# Patient Record
Sex: Female | Born: 1955 | Race: White | Hispanic: No | Marital: Married
Health system: Southern US, Community
[De-identification: ages and names within clinical notes are randomized; demographics above are authoritative.]

---

## 2001-10-08 ENCOUNTER — Other Ambulatory Visit: Admission: RE | Admit: 2001-10-08 | Discharge: 2001-10-08 | Payer: Self-pay | Admitting: Gynecology

## 2003-03-23 ENCOUNTER — Other Ambulatory Visit: Admission: RE | Admit: 2003-03-23 | Discharge: 2003-03-23 | Payer: Self-pay | Admitting: Gynecology

## 2004-05-23 ENCOUNTER — Other Ambulatory Visit: Admission: RE | Admit: 2004-05-23 | Discharge: 2004-05-23 | Payer: Self-pay | Admitting: Gynecology

## 2014-01-28 ENCOUNTER — Other Ambulatory Visit: Payer: Self-pay | Admitting: Physician Assistant

## 2014-01-28 DIAGNOSIS — N6489 Other specified disorders of breast: Secondary | ICD-10-CM

## 2014-02-04 ENCOUNTER — Encounter (INDEPENDENT_AMBULATORY_CARE_PROVIDER_SITE_OTHER): Payer: Self-pay

## 2014-02-04 ENCOUNTER — Ambulatory Visit
Admission: RE | Admit: 2014-02-04 | Discharge: 2014-02-04 | Disposition: A | Discharge status: Home / Self Care | Payer: 59 | Source: Ambulatory Visit | Attending: Physician Assistant | Admitting: Physician Assistant

## 2014-02-04 DIAGNOSIS — N6489 Other specified disorders of breast: Secondary | ICD-10-CM

## 2014-10-12 ENCOUNTER — Other Ambulatory Visit: Payer: Self-pay

## 2014-10-12 DIAGNOSIS — Z1231 Encounter for screening mammogram for malignant neoplasm of breast: Secondary | ICD-10-CM

## 2014-11-13 ENCOUNTER — Ambulatory Visit
Admission: RE | Admit: 2014-11-13 | Discharge: 2014-11-13 | Disposition: A | Discharge status: Home / Self Care | Payer: 59 | Source: Ambulatory Visit

## 2014-11-13 DIAGNOSIS — Z1231 Encounter for screening mammogram for malignant neoplasm of breast: Secondary | ICD-10-CM

## 2015-07-06 ENCOUNTER — Other Ambulatory Visit: Payer: Self-pay | Admitting: Obstetrics and Gynecology

## 2015-07-06 DIAGNOSIS — M810 Age-related osteoporosis without current pathological fracture: Secondary | ICD-10-CM

## 2015-07-27 ENCOUNTER — Other Ambulatory Visit: Payer: Self-pay | Admitting: Obstetrics and Gynecology

## 2015-07-27 DIAGNOSIS — E2839 Other primary ovarian failure: Secondary | ICD-10-CM

## 2015-07-29 ENCOUNTER — Ambulatory Visit
Admission: RE | Admit: 2015-07-29 | Discharge: 2015-07-29 | Disposition: A | Discharge status: Home / Self Care | Payer: 59 | Source: Ambulatory Visit | Attending: Obstetrics and Gynecology | Admitting: Obstetrics and Gynecology

## 2015-07-29 DIAGNOSIS — E2839 Other primary ovarian failure: Secondary | ICD-10-CM

## 2015-12-30 ENCOUNTER — Other Ambulatory Visit: Payer: Self-pay | Admitting: Obstetrics and Gynecology

## 2015-12-30 DIAGNOSIS — Z1231 Encounter for screening mammogram for malignant neoplasm of breast: Secondary | ICD-10-CM

## 2016-01-26 ENCOUNTER — Ambulatory Visit
Admission: RE | Admit: 2016-01-26 | Discharge: 2016-01-26 | Disposition: A | Discharge status: Home / Self Care | Payer: 59 | Source: Ambulatory Visit | Attending: Obstetrics and Gynecology | Admitting: Obstetrics and Gynecology

## 2016-01-26 DIAGNOSIS — Z1231 Encounter for screening mammogram for malignant neoplasm of breast: Secondary | ICD-10-CM

## 2016-09-20 DIAGNOSIS — Z0100 Encounter for examination of eyes and vision without abnormal findings: Secondary | ICD-10-CM | POA: Diagnosis not present

## 2016-10-10 DIAGNOSIS — D235 Other benign neoplasm of skin of trunk: Secondary | ICD-10-CM | POA: Diagnosis not present

## 2016-10-10 DIAGNOSIS — D2272 Melanocytic nevi of left lower limb, including hip: Secondary | ICD-10-CM | POA: Diagnosis not present

## 2016-10-10 DIAGNOSIS — L570 Actinic keratosis: Secondary | ICD-10-CM | POA: Diagnosis not present

## 2016-10-10 DIAGNOSIS — L821 Other seborrheic keratosis: Secondary | ICD-10-CM | POA: Diagnosis not present

## 2016-12-18 ENCOUNTER — Other Ambulatory Visit: Payer: Self-pay | Admitting: Obstetrics and Gynecology

## 2016-12-18 DIAGNOSIS — Z1231 Encounter for screening mammogram for malignant neoplasm of breast: Secondary | ICD-10-CM

## 2016-12-28 DIAGNOSIS — Z01419 Encounter for gynecological examination (general) (routine) without abnormal findings: Secondary | ICD-10-CM | POA: Diagnosis not present

## 2016-12-28 DIAGNOSIS — Z124 Encounter for screening for malignant neoplasm of cervix: Secondary | ICD-10-CM | POA: Diagnosis not present

## 2017-01-29 ENCOUNTER — Encounter: Payer: Self-pay | Admitting: Radiology

## 2017-01-29 ENCOUNTER — Ambulatory Visit
Admission: RE | Admit: 2017-01-29 | Discharge: 2017-01-29 | Disposition: A | Discharge status: Home / Self Care | Payer: 59 | Source: Ambulatory Visit | Attending: Obstetrics and Gynecology | Admitting: Obstetrics and Gynecology

## 2017-01-29 DIAGNOSIS — Z1231 Encounter for screening mammogram for malignant neoplasm of breast: Secondary | ICD-10-CM

## 2017-02-21 DIAGNOSIS — Z23 Encounter for immunization: Secondary | ICD-10-CM | POA: Diagnosis not present

## 2017-02-21 DIAGNOSIS — R21 Rash and other nonspecific skin eruption: Secondary | ICD-10-CM | POA: Diagnosis not present

## 2017-03-05 DIAGNOSIS — Z1211 Encounter for screening for malignant neoplasm of colon: Secondary | ICD-10-CM | POA: Diagnosis not present

## 2017-06-22 ENCOUNTER — Other Ambulatory Visit: Payer: Self-pay | Admitting: Physician Assistant

## 2017-06-22 ENCOUNTER — Ambulatory Visit
Admission: RE | Admit: 2017-06-22 | Discharge: 2017-06-22 | Disposition: A | Discharge status: Home / Self Care | Payer: 59 | Source: Ambulatory Visit | Attending: Physician Assistant | Admitting: Physician Assistant

## 2017-06-22 DIAGNOSIS — S6990XA Unspecified injury of unspecified wrist, hand and finger(s), initial encounter: Secondary | ICD-10-CM | POA: Diagnosis not present

## 2017-06-22 DIAGNOSIS — M79644 Pain in right finger(s): Secondary | ICD-10-CM | POA: Diagnosis not present

## 2017-06-22 DIAGNOSIS — T1490XA Injury, unspecified, initial encounter: Secondary | ICD-10-CM

## 2017-06-22 DIAGNOSIS — S6991XA Unspecified injury of right wrist, hand and finger(s), initial encounter: Secondary | ICD-10-CM | POA: Diagnosis not present

## 2017-10-17 DIAGNOSIS — L821 Other seborrheic keratosis: Secondary | ICD-10-CM | POA: Diagnosis not present

## 2017-10-17 DIAGNOSIS — D239 Other benign neoplasm of skin, unspecified: Secondary | ICD-10-CM | POA: Diagnosis not present

## 2017-10-17 DIAGNOSIS — D2272 Melanocytic nevi of left lower limb, including hip: Secondary | ICD-10-CM | POA: Diagnosis not present

## 2017-10-17 DIAGNOSIS — D485 Neoplasm of uncertain behavior of skin: Secondary | ICD-10-CM | POA: Diagnosis not present

## 2017-10-17 DIAGNOSIS — L570 Actinic keratosis: Secondary | ICD-10-CM | POA: Diagnosis not present

## 2017-10-17 DIAGNOSIS — Z85828 Personal history of other malignant neoplasm of skin: Secondary | ICD-10-CM | POA: Diagnosis not present

## 2017-11-21 DIAGNOSIS — Z0100 Encounter for examination of eyes and vision without abnormal findings: Secondary | ICD-10-CM | POA: Diagnosis not present

## 2018-01-16 DIAGNOSIS — Z124 Encounter for screening for malignant neoplasm of cervix: Secondary | ICD-10-CM | POA: Diagnosis not present

## 2018-01-16 DIAGNOSIS — Z01419 Encounter for gynecological examination (general) (routine) without abnormal findings: Secondary | ICD-10-CM | POA: Diagnosis not present

## 2018-02-07 ENCOUNTER — Other Ambulatory Visit: Payer: Self-pay | Admitting: Obstetrics and Gynecology

## 2018-02-07 DIAGNOSIS — Z1231 Encounter for screening mammogram for malignant neoplasm of breast: Secondary | ICD-10-CM

## 2018-03-08 ENCOUNTER — Ambulatory Visit: Payer: 59

## 2018-03-13 DIAGNOSIS — Z1211 Encounter for screening for malignant neoplasm of colon: Secondary | ICD-10-CM | POA: Diagnosis not present

## 2018-04-10 ENCOUNTER — Ambulatory Visit
Admission: RE | Admit: 2018-04-10 | Discharge: 2018-04-10 | Disposition: A | Discharge status: Home / Self Care | Payer: 59 | Source: Ambulatory Visit | Attending: Obstetrics and Gynecology | Admitting: Obstetrics and Gynecology

## 2018-04-10 DIAGNOSIS — Z1231 Encounter for screening mammogram for malignant neoplasm of breast: Secondary | ICD-10-CM

## 2018-08-16 DIAGNOSIS — Z85828 Personal history of other malignant neoplasm of skin: Secondary | ICD-10-CM | POA: Diagnosis not present

## 2018-08-16 DIAGNOSIS — L570 Actinic keratosis: Secondary | ICD-10-CM | POA: Diagnosis not present

## 2018-08-16 DIAGNOSIS — D2272 Melanocytic nevi of left lower limb, including hip: Secondary | ICD-10-CM | POA: Diagnosis not present

## 2019-02-19 IMAGING — CR DG FINGER LITTLE 2+V*R*
3 series · 3 of 3 positions shown · non-contrast
Comparison: None.

CLINICAL DATA: Little finger pain since an injury 1 month ago.

EXAM:
RIGHT LITTLE FINGER 2+V

[x finger pa right]
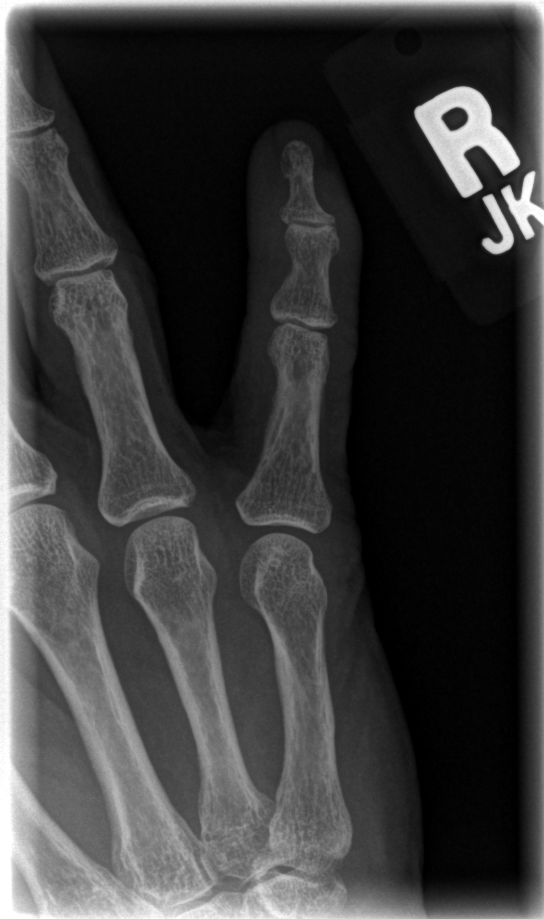

[x finger obl. right]
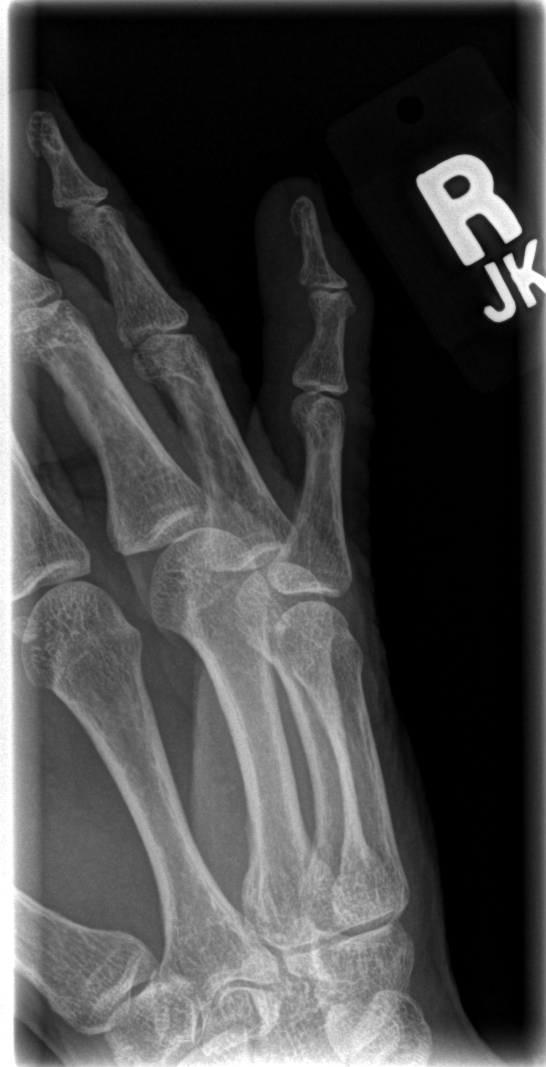

[x finger lateral right]
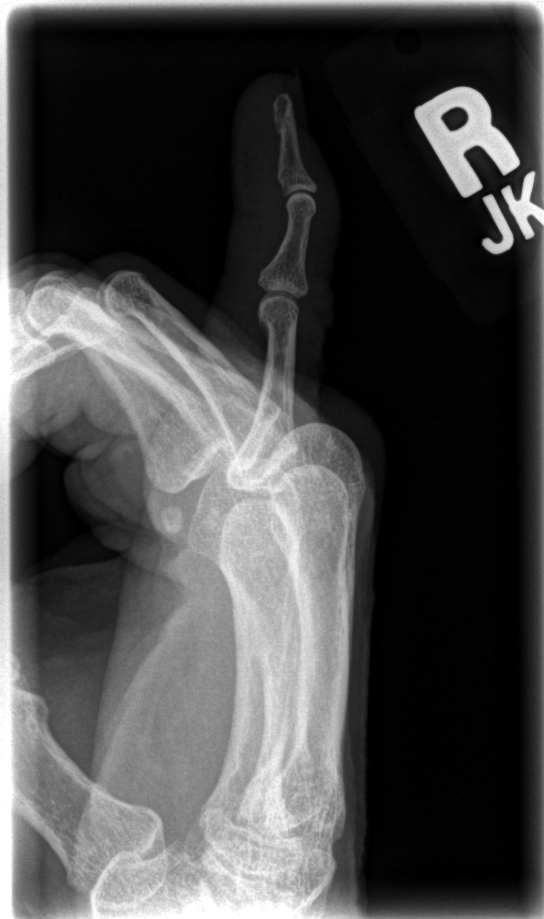

[3 of 3 positions shown; findings below may reference images not displayed]

FINDINGS: There is no fracture or dislocation. There is minimal arthritis of
the DIP joint.
IMPRESSION: Minimal arthritis of the DIP joint.

## 2019-05-22 ENCOUNTER — Other Ambulatory Visit: Payer: Self-pay | Admitting: Obstetrics and Gynecology

## 2019-05-22 DIAGNOSIS — Z1231 Encounter for screening mammogram for malignant neoplasm of breast: Secondary | ICD-10-CM

## 2019-05-26 ENCOUNTER — Ambulatory Visit
Admission: RE | Admit: 2019-05-26 | Discharge: 2019-05-26 | Disposition: A | Discharge status: Home / Self Care | Payer: 59 | Source: Ambulatory Visit | Attending: Obstetrics and Gynecology | Admitting: Obstetrics and Gynecology

## 2019-05-26 ENCOUNTER — Other Ambulatory Visit: Payer: Self-pay

## 2019-05-26 DIAGNOSIS — Z1231 Encounter for screening mammogram for malignant neoplasm of breast: Secondary | ICD-10-CM

## 2020-04-20 ENCOUNTER — Other Ambulatory Visit: Payer: Self-pay | Admitting: Obstetrics and Gynecology

## 2020-04-20 DIAGNOSIS — Z1231 Encounter for screening mammogram for malignant neoplasm of breast: Secondary | ICD-10-CM

## 2020-06-01 ENCOUNTER — Ambulatory Visit
Admission: RE | Admit: 2020-06-01 | Discharge: 2020-06-01 | Disposition: A | Discharge status: Home / Self Care | Payer: 59 | Source: Ambulatory Visit | Attending: Obstetrics and Gynecology | Admitting: Obstetrics and Gynecology

## 2020-06-01 ENCOUNTER — Other Ambulatory Visit: Payer: Self-pay

## 2020-06-01 DIAGNOSIS — Z1231 Encounter for screening mammogram for malignant neoplasm of breast: Secondary | ICD-10-CM

## 2020-08-26 ENCOUNTER — Ambulatory Visit: Payer: 59 | Admitting: Podiatry

## 2020-08-26 ENCOUNTER — Ambulatory Visit (INDEPENDENT_AMBULATORY_CARE_PROVIDER_SITE_OTHER): Payer: 59

## 2020-08-26 ENCOUNTER — Encounter: Payer: Self-pay | Admitting: Podiatry

## 2020-08-26 ENCOUNTER — Other Ambulatory Visit: Payer: Self-pay

## 2020-08-26 DIAGNOSIS — M79671 Pain in right foot: Secondary | ICD-10-CM

## 2020-08-26 DIAGNOSIS — M2021 Hallux rigidus, right foot: Secondary | ICD-10-CM | POA: Diagnosis not present

## 2020-08-26 NOTE — Patient Instructions (Signed)
Hallux Rigidus  Hallux rigidus is a type of joint pain or joint disease (arthritis) that affects your big toe (hallux). This condition involves the joint that connects the base of your big toe to the main part of your foot (metatarsophalangeal joint or MTP joint). This condition can cause your big toe to become stiff, painful, and difficult to move. Symptoms may get worse with movement or in cold or damp weather. The condition gets worse over time. What are the causes? This condition may be caused by having a foot that does not function the way that it should or that has an abnormal shape (structural deformity). These foot problems can run in families and may be passed down from parents to children (are hereditary). This condition can also be caused by:  Injury.  Overuse.  Certain inflammatory diseases, including gout and rheumatoid arthritis. What increases the risk? You are more likely to develop this condition if you have:  A foot bone (metatarsal) that is longer or higher than normal.  A family history of hallux rigidus.  Previously injured your big toe.  Feet that do not have a curve (arch) on the inner side of the foot. This may be called flat feet or fallen arches.  Ankles that turn in when you walk (pronation).  Rheumatoid arthritis or gout.  A job that requires you to stoop down often at work. What are the signs or symptoms? Symptoms of this condition include:  Big toe pain.  Stiffness and difficulty moving the big toe.  Swelling of the toe and surrounding area.  Bone spurs. These are bony growths that can form on the joint of the big toe.  A limp. How is this diagnosed? This condition is diagnosed based on your medical history and a physical exam. You may also have X-rays. How is this treated? This condition is treated by:  Wearing roomy, comfortable shoes that have a large toe box.  Putting orthotic devices in your shoes.  Taking pain medicines.  Having  physical therapy.  Icing the injured area.  Alternating between putting your foot in cold water and then in warm water. If your condition is severe, treatment may include:  Corticosteroid injections to relieve pain.  Surgery to remove bone spurs, fuse damaged bones together, or replace the entire joint. Follow these instructions at home: Managing pain, stiffness, and swelling  Put your feet in cold water for 30 seconds, and then in warm water for 30 seconds. Alternate between the cold and warm water for 5 minutes. Do this several times a day or as told by your health care provider.  If directed, put ice on the injured area. ? Put ice in a plastic bag. ? Place a towel between your skin and the bag. ? Leave the ice on for 20 minutes, 2-3 times a day.   General instructions  Take over-the-counter and prescription medicines only as told by your health care provider.  Do not wear high heels or other restrictive footwear. Wear comfortable, supportive shoes that have a large toe box.  Wear shoe inserts (orthotics) as told by your health care provider, if this applies.  Do foot exercises as instructed by your health care provider or a physical therapist.  Keep all follow-up visits as told by your health care provider. This is important. Contact a health care provider if:  You notice bone spurs or growths on or around your big toe.  Your pain does not get better or it gets worse.  You  have pain while resting.  You have pain in other parts of your body, such as your back, hip, or knee.  You start to limp. Summary  Hallux rigidus is a condition that makes your big toe become stiff, painful, and difficult to move.  It can be caused by injury, overuse, or inflammatory diseases.  This condition may be treated with ice, medicines, physical therapy, and surgery.  Do not wear high heels or other restrictive footwear. Wear comfortable, supportive shoes that have a large toe box. This  information is not intended to replace advice given to you by your health care provider. Make sure you discuss any questions you have with your health care provider. Document Revised: 03/01/2018 Document Reviewed: 03/04/2018 Elsevier Patient Education  2021 [employer].

## 2020-08-29 NOTE — Progress Notes (Signed)
Subjective:   Patient ID: Rachel Stout, female   DOB: 65 y.o.   MRN: [id number]   HPI Patient presents stating she has been having a lot of pain around her big toe joint right foot and states that it seems to be getting gradually worse over time and giving her more problems.  States its been swollen and that it is hard for her to be active like she wants and patient does not smoke   Review of Systems  All other systems reviewed and are negative.       Objective:  Physical Exam Vitals and nursing note reviewed.  Constitutional:      Appearance: She is well-developed.  Pulmonary:     Effort: Pulmonary effort is normal.  Musculoskeletal:        General: Normal range of motion.  Skin:    General: Skin is warm.  Neurological:     Mental Status: She is alert.     Neurovascular status was found to be intact muscle strength adequate range of motion adequate with inflammation pain swelling around the first MPJ right with diminished range of motion of the joint surface and pain with palpation.  Patient has no crepitus currently within the joint and it is quite sore with palpation     Assessment:  Hallux limitus condition with inflammatory capsulitis around the first MPJ right     Plan:  H&P x-ray reviewed discussed condition and I do think long-term that probable osteotomy will be necessary with removal of bone spurs.  I did educate her on this and I also offered her consideration for injection and she is getting decide what she wants to do but most likely will get this fixed fairly soon.  I did discuss the surgery itself and recovery and she will reappoint for consult prior to procedure  X-ray indicates that there is some spurring around the lateral side of the joint with narrowing of the joint surface and no current flattening with mild elevation first metatarsal

## 2021-04-25 ENCOUNTER — Other Ambulatory Visit: Payer: Self-pay | Admitting: Obstetrics and Gynecology

## 2021-04-25 DIAGNOSIS — Z1231 Encounter for screening mammogram for malignant neoplasm of breast: Secondary | ICD-10-CM

## 2021-04-26 DIAGNOSIS — Z131 Encounter for screening for diabetes mellitus: Secondary | ICD-10-CM | POA: Diagnosis not present

## 2021-04-26 DIAGNOSIS — Z23 Encounter for immunization: Secondary | ICD-10-CM | POA: Diagnosis not present

## 2021-04-26 DIAGNOSIS — Z1159 Encounter for screening for other viral diseases: Secondary | ICD-10-CM | POA: Diagnosis not present

## 2021-04-26 DIAGNOSIS — Z136 Encounter for screening for cardiovascular disorders: Secondary | ICD-10-CM | POA: Diagnosis not present

## 2021-04-26 DIAGNOSIS — Z0000 Encounter for general adult medical examination without abnormal findings: Secondary | ICD-10-CM | POA: Diagnosis not present

## 2021-06-13 ENCOUNTER — Ambulatory Visit
Admission: RE | Admit: 2021-06-13 | Discharge: 2021-06-13 | Disposition: A | Discharge status: Home / Self Care | Payer: Medicare HMO | Source: Ambulatory Visit | Attending: Obstetrics and Gynecology | Admitting: Obstetrics and Gynecology

## 2021-06-13 DIAGNOSIS — Z1231 Encounter for screening mammogram for malignant neoplasm of breast: Secondary | ICD-10-CM

## 2022-03-07 DIAGNOSIS — H5213 Myopia, bilateral: Secondary | ICD-10-CM | POA: Diagnosis not present

## 2022-03-07 DIAGNOSIS — H2513 Age-related nuclear cataract, bilateral: Secondary | ICD-10-CM | POA: Diagnosis not present

## 2022-05-08 ENCOUNTER — Other Ambulatory Visit: Payer: Self-pay | Admitting: Physician Assistant

## 2022-05-08 DIAGNOSIS — Z1231 Encounter for screening mammogram for malignant neoplasm of breast: Secondary | ICD-10-CM

## 2022-05-16 ENCOUNTER — Other Ambulatory Visit: Payer: Self-pay | Admitting: Physician Assistant

## 2022-05-16 DIAGNOSIS — M8580 Other specified disorders of bone density and structure, unspecified site: Secondary | ICD-10-CM | POA: Diagnosis not present

## 2022-05-16 DIAGNOSIS — Z23 Encounter for immunization: Secondary | ICD-10-CM | POA: Diagnosis not present

## 2022-05-16 DIAGNOSIS — Z0000 Encounter for general adult medical examination without abnormal findings: Secondary | ICD-10-CM | POA: Diagnosis not present

## 2022-05-16 DIAGNOSIS — E7800 Pure hypercholesterolemia, unspecified: Secondary | ICD-10-CM | POA: Diagnosis not present

## 2022-05-16 DIAGNOSIS — Z13820 Encounter for screening for osteoporosis: Secondary | ICD-10-CM | POA: Diagnosis not present

## 2022-05-22 DIAGNOSIS — Z880 Allergy status to penicillin: Secondary | ICD-10-CM | POA: Diagnosis not present

## 2022-05-22 DIAGNOSIS — Z87891 Personal history of nicotine dependence: Secondary | ICD-10-CM | POA: Diagnosis not present

## 2022-05-22 DIAGNOSIS — Z87892 Personal history of anaphylaxis: Secondary | ICD-10-CM | POA: Diagnosis not present

## 2022-06-05 DIAGNOSIS — R69 Illness, unspecified: Secondary | ICD-10-CM | POA: Diagnosis not present

## 2022-07-04 ENCOUNTER — Ambulatory Visit
Admission: RE | Admit: 2022-07-04 | Discharge: 2022-07-04 | Disposition: A | Discharge status: Home / Self Care | Payer: Medicare HMO | Source: Ambulatory Visit | Attending: Physician Assistant | Admitting: Physician Assistant

## 2022-07-04 DIAGNOSIS — Z1231 Encounter for screening mammogram for malignant neoplasm of breast: Secondary | ICD-10-CM

## 2022-07-06 DIAGNOSIS — R69 Illness, unspecified: Secondary | ICD-10-CM | POA: Diagnosis not present

## 2022-07-18 ENCOUNTER — Ambulatory Visit
Admission: RE | Admit: 2022-07-18 | Discharge: 2022-07-18 | Disposition: A | Discharge status: Home / Self Care | Payer: Medicare HMO | Source: Ambulatory Visit | Attending: Physician Assistant | Admitting: Physician Assistant

## 2022-07-18 DIAGNOSIS — Z780 Asymptomatic menopausal state: Secondary | ICD-10-CM | POA: Diagnosis not present

## 2022-07-18 DIAGNOSIS — M8580 Other specified disorders of bone density and structure, unspecified site: Secondary | ICD-10-CM

## 2022-07-18 DIAGNOSIS — M810 Age-related osteoporosis without current pathological fracture: Secondary | ICD-10-CM | POA: Diagnosis not present

## 2022-07-18 DIAGNOSIS — M85852 Other specified disorders of bone density and structure, left thigh: Secondary | ICD-10-CM | POA: Diagnosis not present

## 2022-07-25 DIAGNOSIS — Z124 Encounter for screening for malignant neoplasm of cervix: Secondary | ICD-10-CM | POA: Diagnosis not present

## 2022-07-25 DIAGNOSIS — Z01419 Encounter for gynecological examination (general) (routine) without abnormal findings: Secondary | ICD-10-CM | POA: Diagnosis not present

## 2022-08-04 DIAGNOSIS — R69 Illness, unspecified: Secondary | ICD-10-CM | POA: Diagnosis not present

## 2022-09-06 DIAGNOSIS — Z85828 Personal history of other malignant neoplasm of skin: Secondary | ICD-10-CM | POA: Diagnosis not present

## 2022-09-06 DIAGNOSIS — L578 Other skin changes due to chronic exposure to nonionizing radiation: Secondary | ICD-10-CM | POA: Diagnosis not present

## 2022-09-06 DIAGNOSIS — L570 Actinic keratosis: Secondary | ICD-10-CM | POA: Diagnosis not present

## 2022-09-06 DIAGNOSIS — L821 Other seborrheic keratosis: Secondary | ICD-10-CM | POA: Diagnosis not present

## 2022-09-06 DIAGNOSIS — D225 Melanocytic nevi of trunk: Secondary | ICD-10-CM | POA: Diagnosis not present

## 2022-09-06 DIAGNOSIS — D485 Neoplasm of uncertain behavior of skin: Secondary | ICD-10-CM | POA: Diagnosis not present

## 2022-09-06 DIAGNOSIS — D2272 Melanocytic nevi of left lower limb, including hip: Secondary | ICD-10-CM | POA: Diagnosis not present

## 2022-09-26 DIAGNOSIS — L905 Scar conditions and fibrosis of skin: Secondary | ICD-10-CM | POA: Diagnosis not present

## 2022-09-26 DIAGNOSIS — D0339 Melanoma in situ of other parts of face: Secondary | ICD-10-CM | POA: Diagnosis not present

## 2022-09-27 DIAGNOSIS — D0339 Melanoma in situ of other parts of face: Secondary | ICD-10-CM | POA: Diagnosis not present

## 2022-11-04 DIAGNOSIS — R69 Illness, unspecified: Secondary | ICD-10-CM | POA: Diagnosis not present

## 2022-11-06 ENCOUNTER — Other Ambulatory Visit: Payer: Medicare HMO

## 2022-11-10 DIAGNOSIS — R5383 Other fatigue: Secondary | ICD-10-CM | POA: Diagnosis not present

## 2022-11-10 DIAGNOSIS — E559 Vitamin D deficiency, unspecified: Secondary | ICD-10-CM | POA: Diagnosis not present

## 2022-11-10 DIAGNOSIS — M810 Age-related osteoporosis without current pathological fracture: Secondary | ICD-10-CM | POA: Diagnosis not present

## 2022-12-04 DIAGNOSIS — R69 Illness, unspecified: Secondary | ICD-10-CM | POA: Diagnosis not present

## 2023-01-04 DIAGNOSIS — R69 Illness, unspecified: Secondary | ICD-10-CM | POA: Diagnosis not present

## 2023-01-08 DIAGNOSIS — Z5189 Encounter for other specified aftercare: Secondary | ICD-10-CM | POA: Diagnosis not present

## 2023-02-04 DIAGNOSIS — R69 Illness, unspecified: Secondary | ICD-10-CM | POA: Diagnosis not present

## 2023-03-06 DIAGNOSIS — R5383 Other fatigue: Secondary | ICD-10-CM | POA: Diagnosis not present

## 2023-03-06 DIAGNOSIS — R69 Illness, unspecified: Secondary | ICD-10-CM | POA: Diagnosis not present

## 2023-03-12 DIAGNOSIS — H52203 Unspecified astigmatism, bilateral: Secondary | ICD-10-CM | POA: Diagnosis not present

## 2023-03-12 DIAGNOSIS — H2513 Age-related nuclear cataract, bilateral: Secondary | ICD-10-CM | POA: Diagnosis not present

## 2023-03-13 DIAGNOSIS — Z823 Family history of stroke: Secondary | ICD-10-CM | POA: Diagnosis not present

## 2023-03-13 DIAGNOSIS — K5900 Constipation, unspecified: Secondary | ICD-10-CM | POA: Diagnosis not present

## 2023-03-13 DIAGNOSIS — Z8249 Family history of ischemic heart disease and other diseases of the circulatory system: Secondary | ICD-10-CM | POA: Diagnosis not present

## 2023-03-13 DIAGNOSIS — M8580 Other specified disorders of bone density and structure, unspecified site: Secondary | ICD-10-CM | POA: Diagnosis not present

## 2023-03-13 DIAGNOSIS — Z85820 Personal history of malignant melanoma of skin: Secondary | ICD-10-CM | POA: Diagnosis not present

## 2023-03-13 DIAGNOSIS — Z809 Family history of malignant neoplasm, unspecified: Secondary | ICD-10-CM | POA: Diagnosis not present

## 2023-03-13 DIAGNOSIS — E039 Hypothyroidism, unspecified: Secondary | ICD-10-CM | POA: Diagnosis not present

## 2023-03-13 DIAGNOSIS — Z833 Family history of diabetes mellitus: Secondary | ICD-10-CM | POA: Diagnosis not present

## 2023-03-13 DIAGNOSIS — I10 Essential (primary) hypertension: Secondary | ICD-10-CM | POA: Diagnosis not present

## 2023-03-13 DIAGNOSIS — Z85828 Personal history of other malignant neoplasm of skin: Secondary | ICD-10-CM | POA: Diagnosis not present

## 2023-03-13 DIAGNOSIS — Z800 Family history of malignant neoplasm of digestive organs: Secondary | ICD-10-CM | POA: Diagnosis not present

## 2023-05-28 ENCOUNTER — Other Ambulatory Visit: Payer: Self-pay | Admitting: Physician Assistant

## 2023-05-28 DIAGNOSIS — Z1231 Encounter for screening mammogram for malignant neoplasm of breast: Secondary | ICD-10-CM

## 2023-06-11 DIAGNOSIS — M810 Age-related osteoporosis without current pathological fracture: Secondary | ICD-10-CM | POA: Diagnosis not present

## 2023-06-11 DIAGNOSIS — Z23 Encounter for immunization: Secondary | ICD-10-CM | POA: Diagnosis not present

## 2023-06-11 DIAGNOSIS — E039 Hypothyroidism, unspecified: Secondary | ICD-10-CM | POA: Diagnosis not present

## 2023-06-11 DIAGNOSIS — Z85820 Personal history of malignant melanoma of skin: Secondary | ICD-10-CM | POA: Diagnosis not present

## 2023-06-11 DIAGNOSIS — S81811A Laceration without foreign body, right lower leg, initial encounter: Secondary | ICD-10-CM | POA: Diagnosis not present

## 2023-06-11 DIAGNOSIS — Z0000 Encounter for general adult medical examination without abnormal findings: Secondary | ICD-10-CM | POA: Diagnosis not present

## 2023-06-11 DIAGNOSIS — E7800 Pure hypercholesterolemia, unspecified: Secondary | ICD-10-CM | POA: Diagnosis not present

## 2023-06-12 DIAGNOSIS — E039 Hypothyroidism, unspecified: Secondary | ICD-10-CM | POA: Diagnosis not present

## 2023-06-12 DIAGNOSIS — E7800 Pure hypercholesterolemia, unspecified: Secondary | ICD-10-CM | POA: Diagnosis not present

## 2023-07-09 ENCOUNTER — Ambulatory Visit
Admission: RE | Admit: 2023-07-09 | Discharge: 2023-07-09 | Disposition: A | Discharge status: Home / Self Care | Payer: Medicare HMO | Source: Ambulatory Visit | Attending: Physician Assistant | Admitting: Physician Assistant

## 2023-07-09 DIAGNOSIS — Z1231 Encounter for screening mammogram for malignant neoplasm of breast: Secondary | ICD-10-CM

## 2023-09-12 DIAGNOSIS — L578 Other skin changes due to chronic exposure to nonionizing radiation: Secondary | ICD-10-CM | POA: Diagnosis not present

## 2023-09-12 DIAGNOSIS — L570 Actinic keratosis: Secondary | ICD-10-CM | POA: Diagnosis not present

## 2023-09-12 DIAGNOSIS — L821 Other seborrheic keratosis: Secondary | ICD-10-CM | POA: Diagnosis not present

## 2023-09-12 DIAGNOSIS — D225 Melanocytic nevi of trunk: Secondary | ICD-10-CM | POA: Diagnosis not present

## 2023-09-12 DIAGNOSIS — Z85828 Personal history of other malignant neoplasm of skin: Secondary | ICD-10-CM | POA: Diagnosis not present

## 2023-09-12 DIAGNOSIS — D2272 Melanocytic nevi of left lower limb, including hip: Secondary | ICD-10-CM | POA: Diagnosis not present

## 2023-10-16 DIAGNOSIS — E039 Hypothyroidism, unspecified: Secondary | ICD-10-CM | POA: Diagnosis not present

## 2023-10-16 DIAGNOSIS — M810 Age-related osteoporosis without current pathological fracture: Secondary | ICD-10-CM | POA: Diagnosis not present

## 2023-10-16 DIAGNOSIS — E559 Vitamin D deficiency, unspecified: Secondary | ICD-10-CM | POA: Diagnosis not present

## 2023-10-26 ENCOUNTER — Other Ambulatory Visit (HOSPITAL_BASED_OUTPATIENT_CLINIC_OR_DEPARTMENT_OTHER): Payer: Self-pay | Admitting: Internal Medicine

## 2023-10-26 DIAGNOSIS — Z13820 Encounter for screening for osteoporosis: Secondary | ICD-10-CM

## 2024-03-18 DIAGNOSIS — H52203 Unspecified astigmatism, bilateral: Secondary | ICD-10-CM | POA: Diagnosis not present

## 2024-03-18 DIAGNOSIS — H5203 Hypermetropia, bilateral: Secondary | ICD-10-CM | POA: Diagnosis not present

## 2024-03-18 DIAGNOSIS — H2513 Age-related nuclear cataract, bilateral: Secondary | ICD-10-CM | POA: Diagnosis not present

## 2024-04-22 DIAGNOSIS — E039 Hypothyroidism, unspecified: Secondary | ICD-10-CM | POA: Diagnosis not present

## 2024-04-22 DIAGNOSIS — E7800 Pure hypercholesterolemia, unspecified: Secondary | ICD-10-CM | POA: Diagnosis not present

## 2024-06-24 ENCOUNTER — Other Ambulatory Visit: Payer: Self-pay | Admitting: Physician Assistant

## 2024-06-24 DIAGNOSIS — Z1231 Encounter for screening mammogram for malignant neoplasm of breast: Secondary | ICD-10-CM

## 2024-07-10 ENCOUNTER — Other Ambulatory Visit: Payer: Self-pay | Admitting: Physician Assistant

## 2024-07-10 ENCOUNTER — Ambulatory Visit
Admission: RE | Admit: 2024-07-10 | Discharge: 2024-07-10 | Disposition: A | Source: Ambulatory Visit | Attending: Physician Assistant | Admitting: Physician Assistant

## 2024-07-10 DIAGNOSIS — N6320 Unspecified lump in the left breast, unspecified quadrant: Secondary | ICD-10-CM

## 2024-07-10 DIAGNOSIS — Z1231 Encounter for screening mammogram for malignant neoplasm of breast: Secondary | ICD-10-CM

## 2024-07-29 ENCOUNTER — Other Ambulatory Visit

## 2024-07-29 ENCOUNTER — Encounter
